# Patient Record
Sex: Female | Born: 1970 | Race: White | Hispanic: No | Marital: Single | Smoking: Never smoker
Health system: Southern US, Community
[De-identification: ages and names within clinical notes are randomized; demographics above are authoritative.]

## PROBLEM LIST (undated history)

## (undated) DIAGNOSIS — F32A Depression, unspecified: Secondary | ICD-10-CM

## (undated) DIAGNOSIS — T7840XA Allergy, unspecified, initial encounter: Secondary | ICD-10-CM

## (undated) HISTORY — DX: Allergy, unspecified, initial encounter: T78.40XA

## (undated) HISTORY — DX: Depression, unspecified: F32.A

---

## 2015-05-29 ENCOUNTER — Other Ambulatory Visit: Payer: Self-pay | Admitting: Physician Assistant

## 2015-05-29 DIAGNOSIS — N950 Postmenopausal bleeding: Secondary | ICD-10-CM

## 2015-05-30 ENCOUNTER — Other Ambulatory Visit: Payer: Self-pay

## 2015-05-30 DIAGNOSIS — Z1231 Encounter for screening mammogram for malignant neoplasm of breast: Secondary | ICD-10-CM

## 2015-06-03 ENCOUNTER — Other Ambulatory Visit: Payer: Self-pay

## 2015-06-04 ENCOUNTER — Ambulatory Visit
Admission: RE | Admit: 2015-06-04 | Discharge: 2015-06-04 | Disposition: A | Discharge status: Home / Self Care | Payer: 59 | Source: Ambulatory Visit | Attending: Physician Assistant | Admitting: Physician Assistant

## 2015-06-04 DIAGNOSIS — N950 Postmenopausal bleeding: Secondary | ICD-10-CM

## 2015-06-09 ENCOUNTER — Other Ambulatory Visit: Payer: Self-pay | Admitting: Family Medicine

## 2015-06-09 DIAGNOSIS — N83202 Unspecified ovarian cyst, left side: Secondary | ICD-10-CM

## 2015-06-23 ENCOUNTER — Ambulatory Visit
Admission: RE | Admit: 2015-06-23 | Discharge: 2015-06-23 | Disposition: A | Discharge status: Home / Self Care | Payer: 59 | Source: Ambulatory Visit

## 2015-06-23 DIAGNOSIS — Z1231 Encounter for screening mammogram for malignant neoplasm of breast: Secondary | ICD-10-CM

## 2015-10-16 ENCOUNTER — Other Ambulatory Visit: Payer: Self-pay | Admitting: Family Medicine

## 2015-10-16 DIAGNOSIS — N83202 Unspecified ovarian cyst, left side: Secondary | ICD-10-CM

## 2015-10-28 ENCOUNTER — Ambulatory Visit
Admission: RE | Admit: 2015-10-28 | Discharge: 2015-10-28 | Disposition: A | Discharge status: Home / Self Care | Payer: 59 | Source: Ambulatory Visit | Attending: Family Medicine | Admitting: Family Medicine

## 2015-10-28 DIAGNOSIS — N83202 Unspecified ovarian cyst, left side: Secondary | ICD-10-CM

## 2016-02-23 ENCOUNTER — Other Ambulatory Visit (HOSPITAL_COMMUNITY)
Admission: RE | Admit: 2016-02-23 | Discharge: 2016-02-23 | Disposition: A | Discharge status: Home / Self Care | Payer: BLUE CROSS/BLUE SHIELD | Source: Ambulatory Visit | Attending: Family Medicine | Admitting: Family Medicine

## 2016-02-23 ENCOUNTER — Other Ambulatory Visit: Payer: Self-pay | Admitting: Family Medicine

## 2016-02-23 DIAGNOSIS — Z1151 Encounter for screening for human papillomavirus (HPV): Secondary | ICD-10-CM | POA: Insufficient documentation

## 2016-02-23 DIAGNOSIS — Z124 Encounter for screening for malignant neoplasm of cervix: Secondary | ICD-10-CM | POA: Insufficient documentation

## 2016-02-25 LAB — CYTOLOGY - PAP

## 2016-09-20 IMAGING — US US TRANSVAGINAL NON-OB
1 series · 13 of 25 positions shown · non-contrast
Comparison: None

CLINICAL DATA: Postmenopausal bleeding, no report of pain.



[Series 1: us transvaginal non-ob · 0.37mm/px · 13 of 40 slices shown]
[im 1/40]
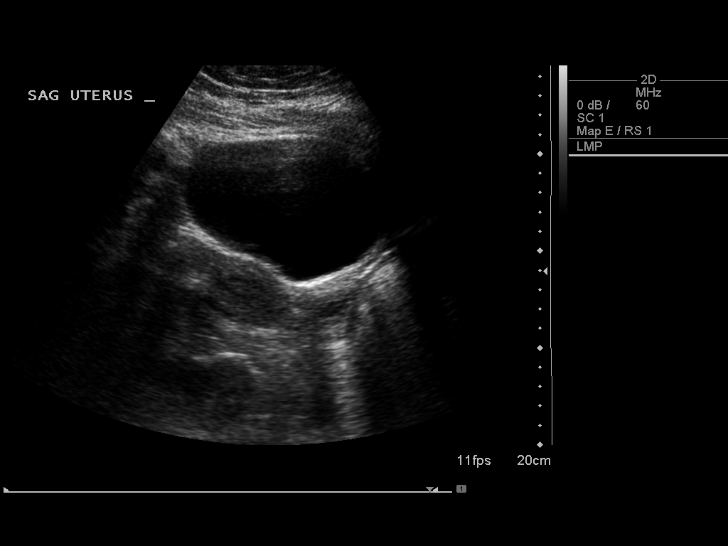
[im 4/40]
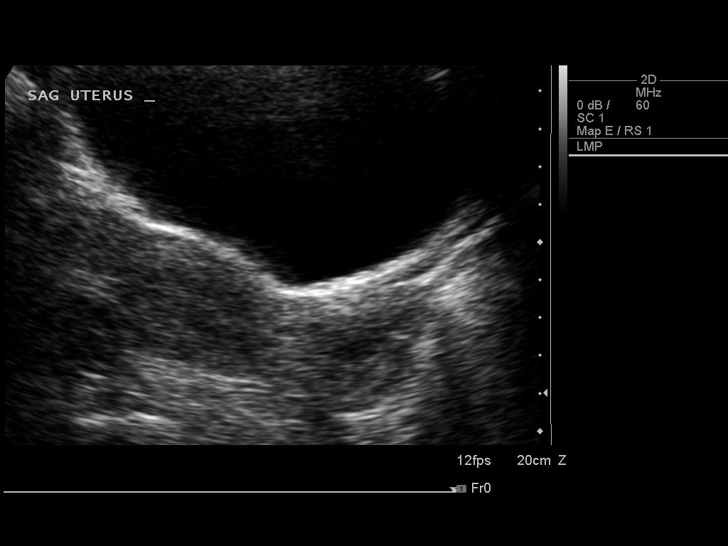
[im 7/40]
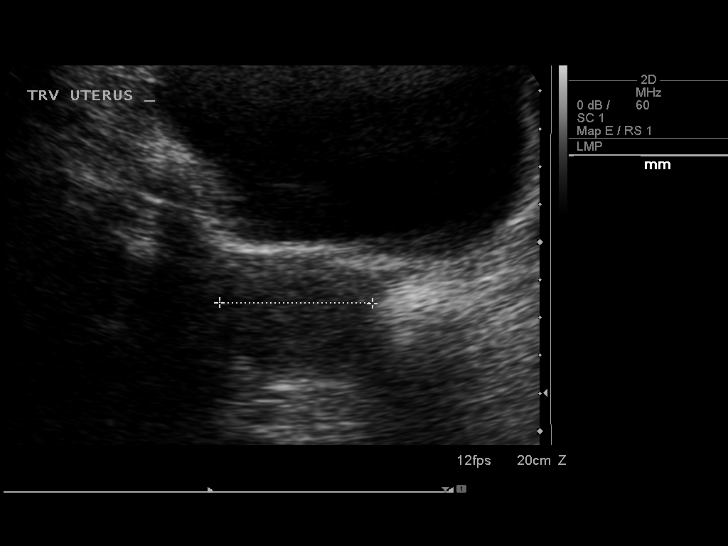
[im 10/40]
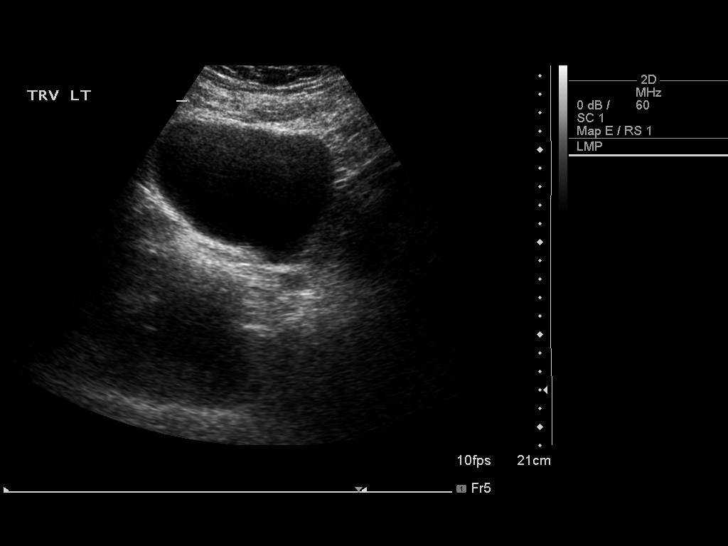
[im 14/40]
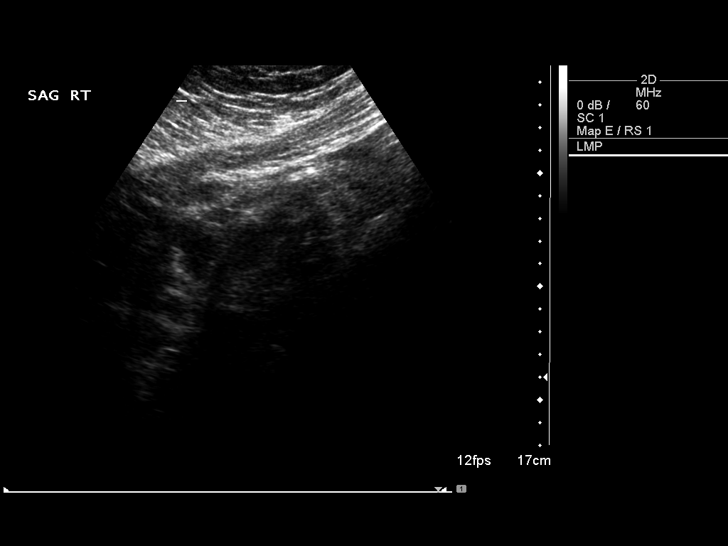
[im 17/40]
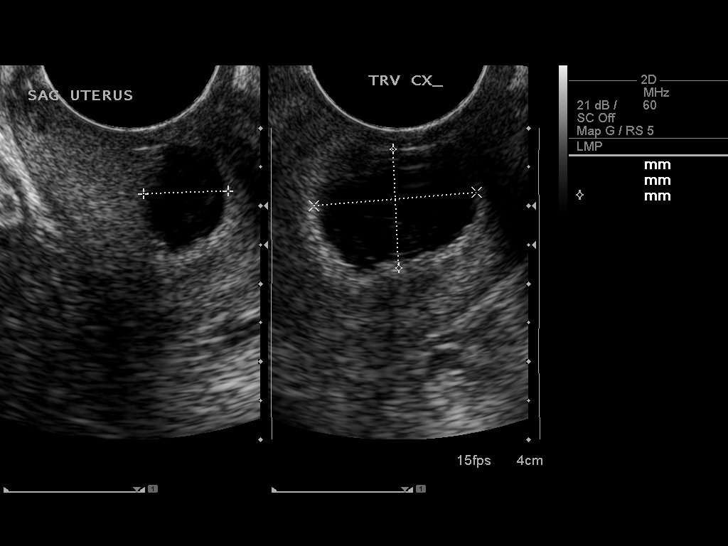
[im 20/40]
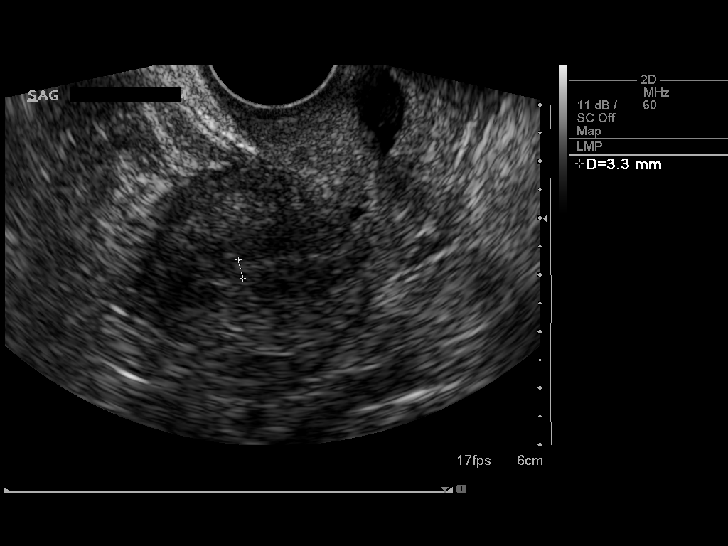
[im 23/40]
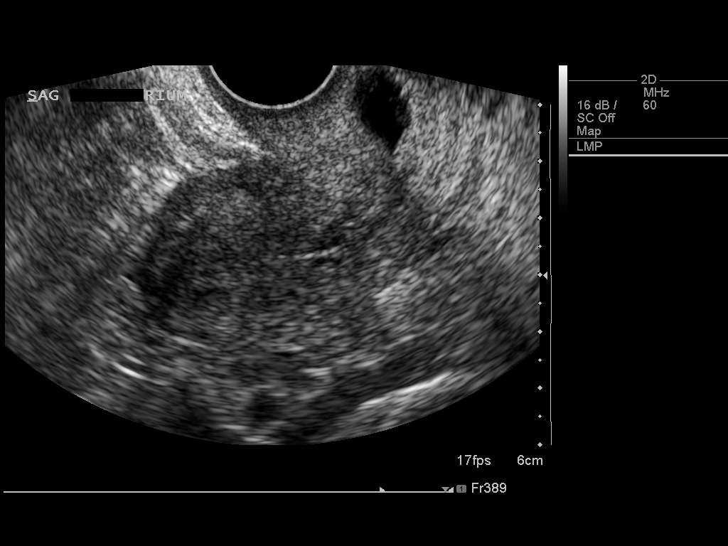
[im 27/40]
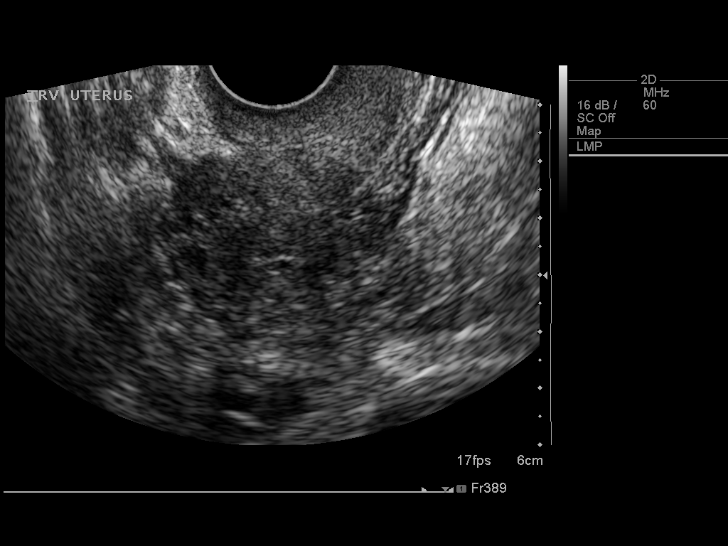
[im 30/40]
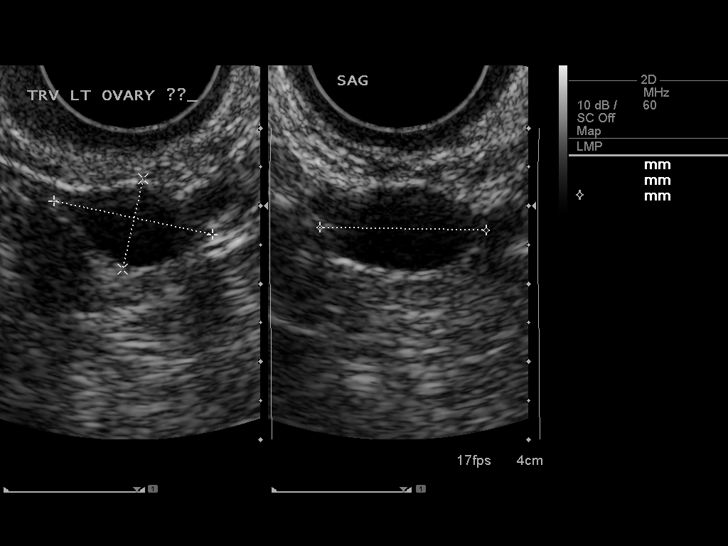
[im 33/40]
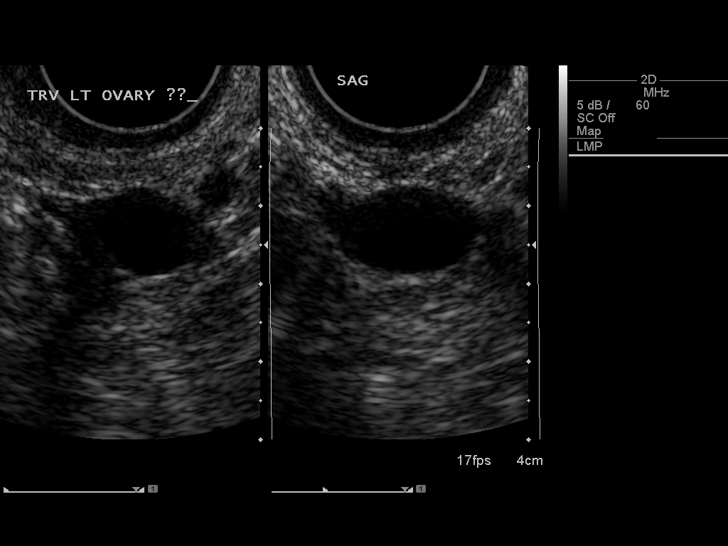
[im 36/40]
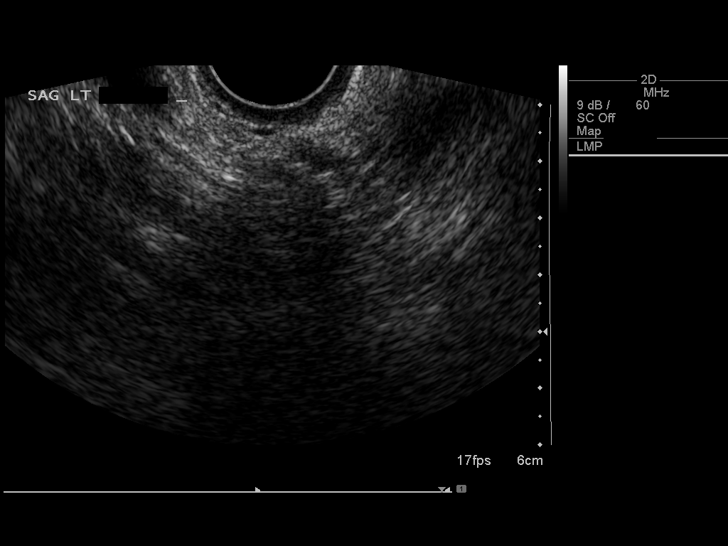
[im 40/40]
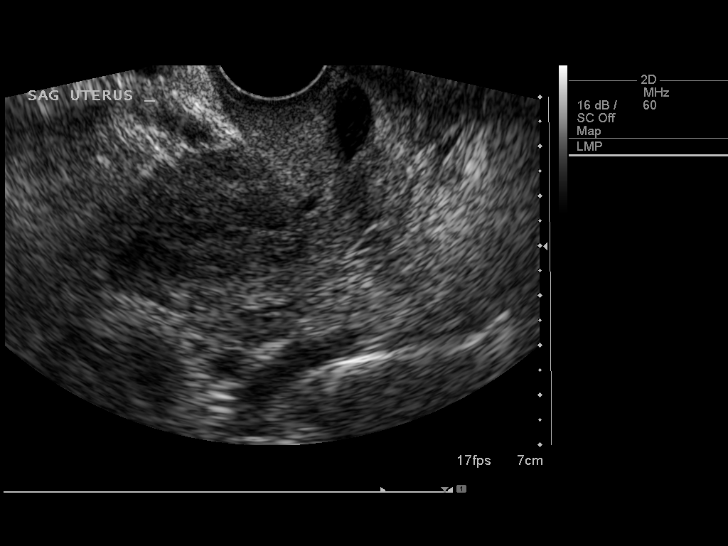

[13 of 25 positions shown; findings below may reference images not displayed]

FINDINGS: Uterus

Measurements: 8.5 x 3.3 x 4.1 cm.. No fibroids are observed. In the
lower uterine segment a cystic structure measuring 1.1 x 2.1 x
cm is demonstrated. This may reflect a nabothian cyst.

Endometrium

Thickness: 3.3 mm.  No focal abnormality visualized.

Right ovary

The right ovary could not be demonstrated.

Left ovary

Measurements: 2.1 x 1.2 x 2.1 cm.. There is no free pelvic fluid.

Other findings

No free fluid.
IMPRESSION: 1. The uterus is normal in contour and exhibits no fibroids. There
is a probable nabothian cyst. The endometrial stripe is not
abnormally thickened. In the setting of post-menopausal bleeding,
this is consistent with a benign etiology such as endometrial
atrophy. If bleeding remains unresponsive to hormonal or medical
therapy, sonohysterogram should be considered for focal lesion
work-up. (Ref: Radiological Reasoning: Algorithmic Workup of
Abnormal Vaginal Bleeding with Endovaginal Sonography and
Sonohysterography. AJR [id number]; 191:S68-73)
2. There is a probable left ovarian cyst measuring 1.8 cm in
greatest dimension. The right ovary could not be demonstrated.
3. There is no free pelvic fluid.

## 2017-02-13 IMAGING — US US PELVIS COMPLETE
1 series · 13 of 25 positions shown · non-contrast
Comparison: Pelvic ultrasound June 04, 2015

CLINICAL DATA: Follow-up of left ovarian cyst, asymptomatic ;
patient very menopausal with last normal menstrual period June 13, 2015.

EXAM:
TRANSABDOMINAL AND TRANSVAGINAL ULTRASOUND OF PELVIS
TECHNIQUE: Both transabdominal and transvaginal ultrasound examinations of the
pelvis were performed. Transabdominal technique was performed for
global imaging of the pelvis including uterus, ovaries, adnexal
regions, and pelvic cul-de-sac. It was necessary to proceed with
endovaginal exam following the transabdominal exam to visualize the
endometrium, ovaries, and adnexal regions..

[Series 1: us pelvis complete · 0.33mm/px · 13 of 55 slices shown]
[im 1/55]
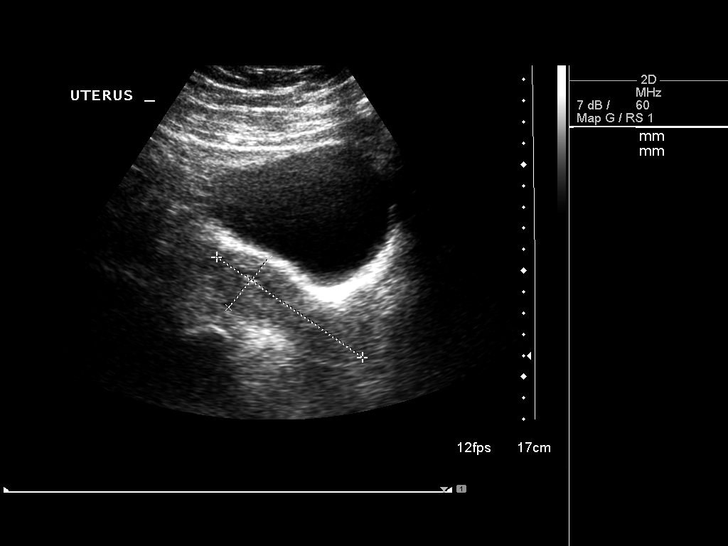
[im 5/55]
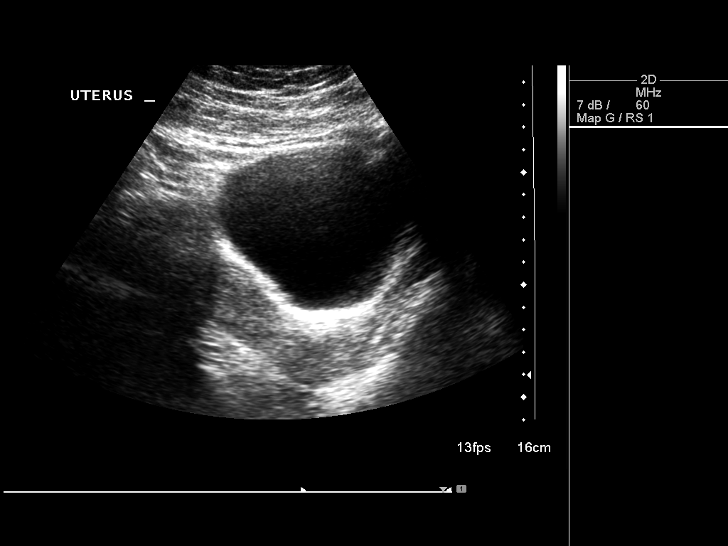
[im 10/55]
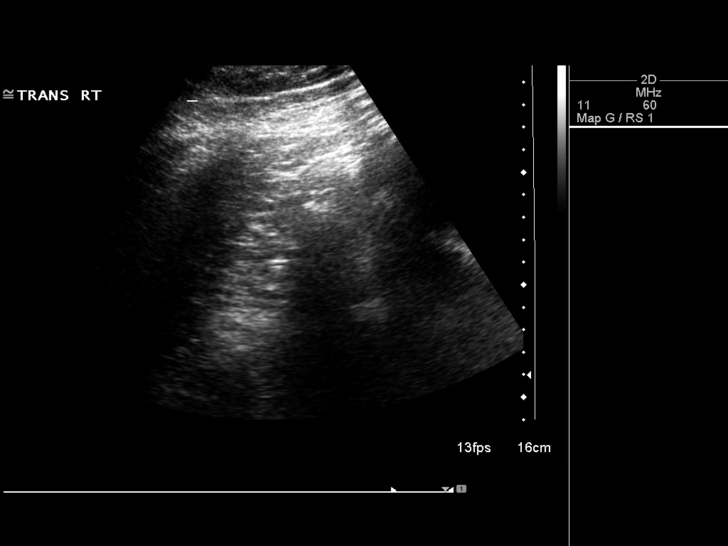
[im 14/55]
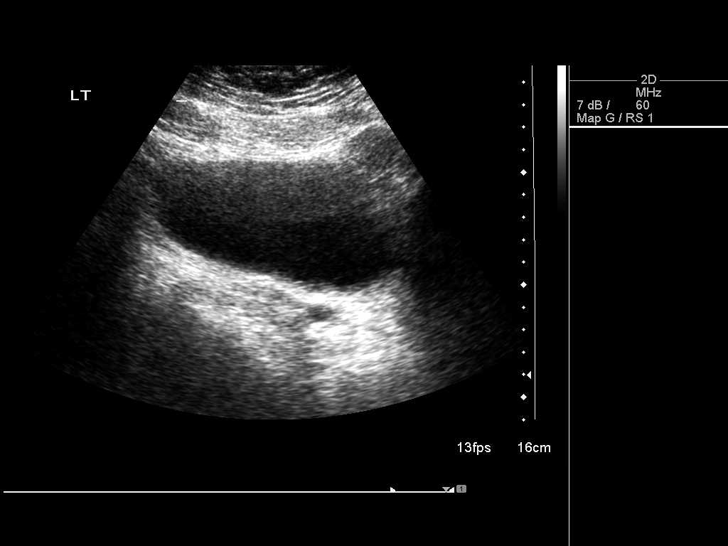
[im 19/55]
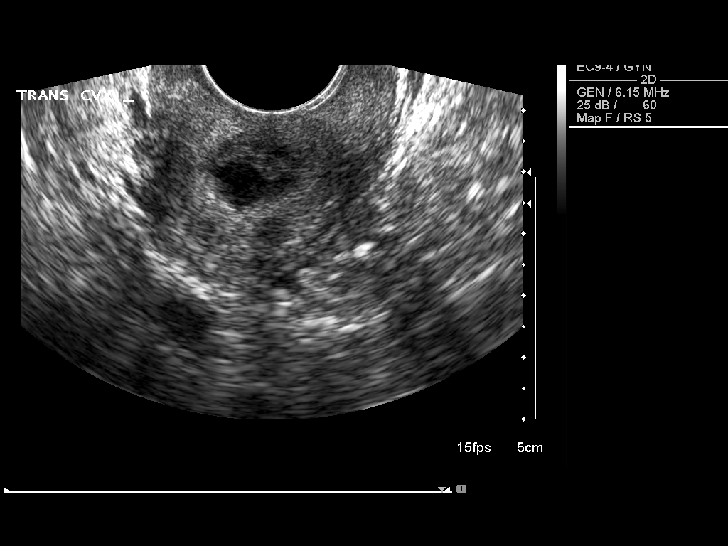
[im 23/55]
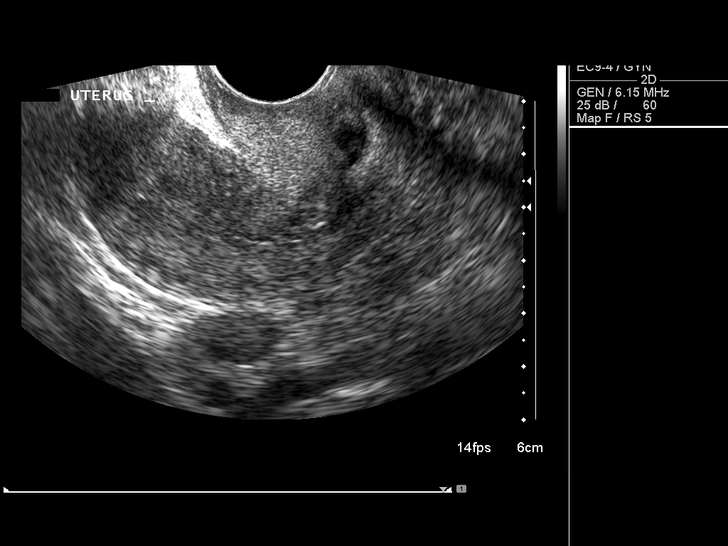
[im 28/55]
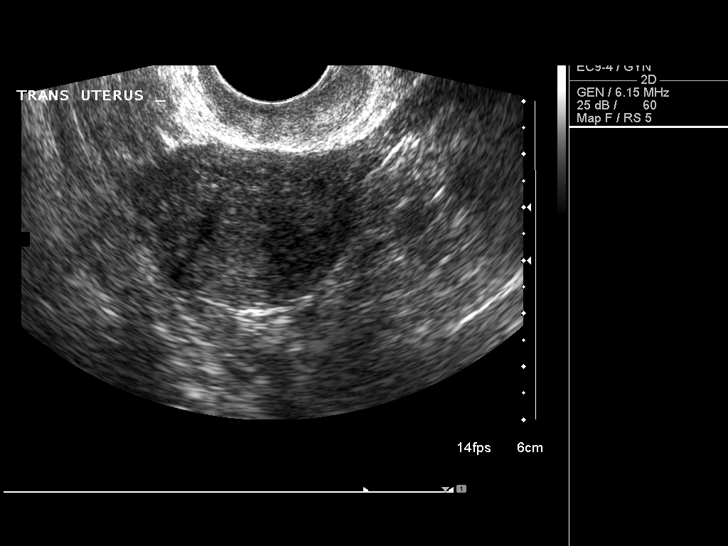
[im 32/55]
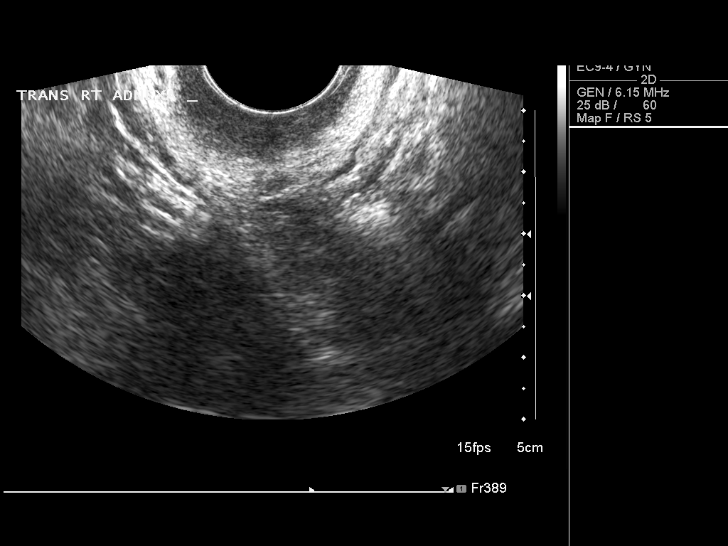
[im 37/55]
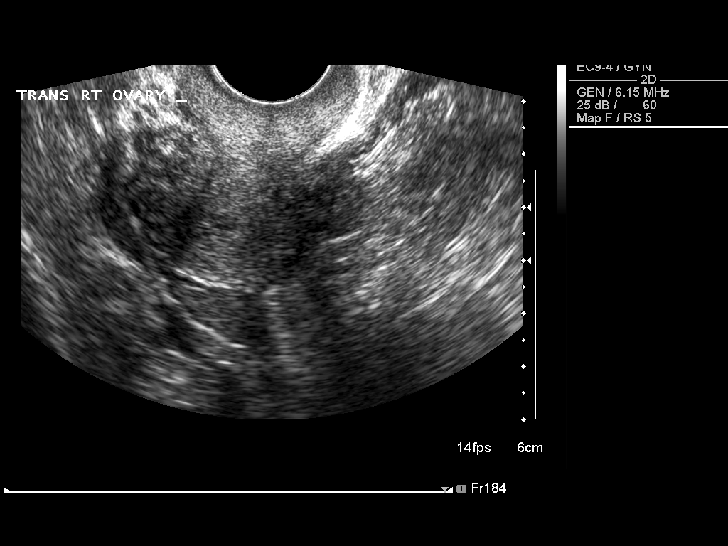
[im 41/55]
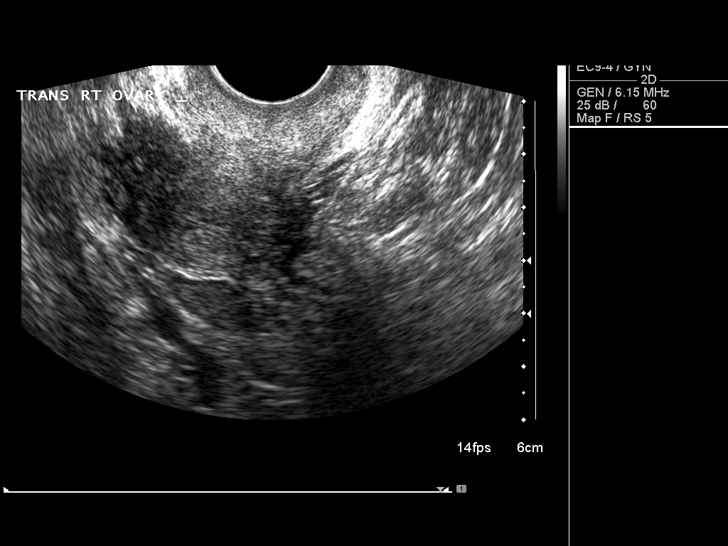
[im 46/55]
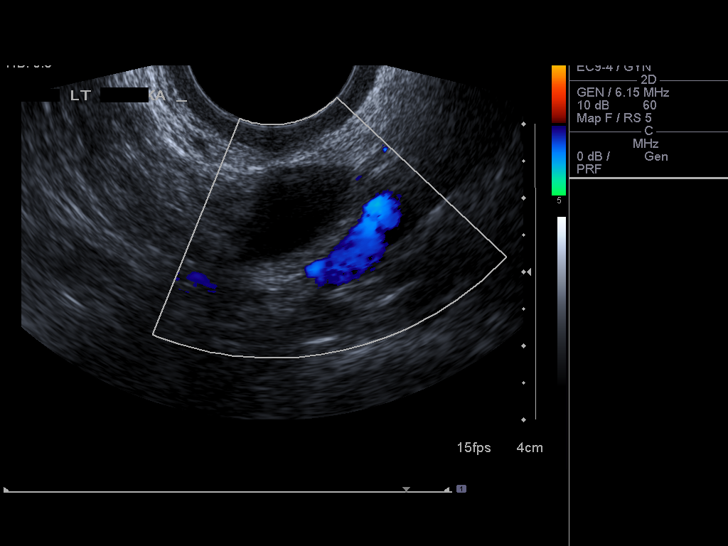
[im 50/55]
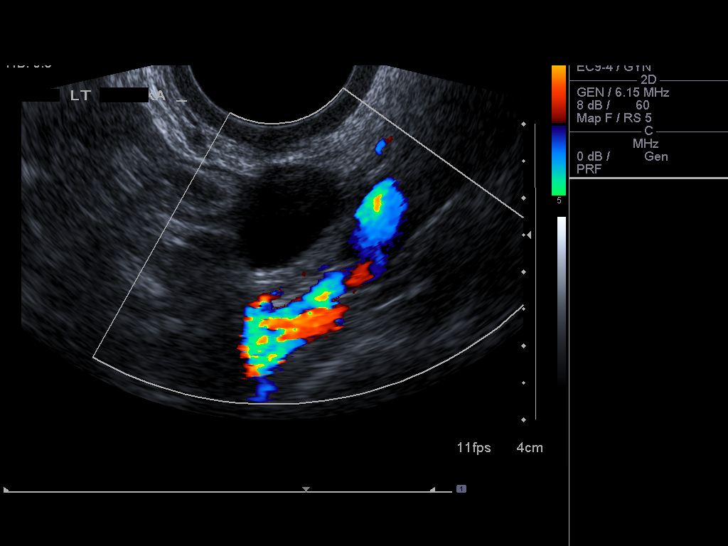
[im 55/55]
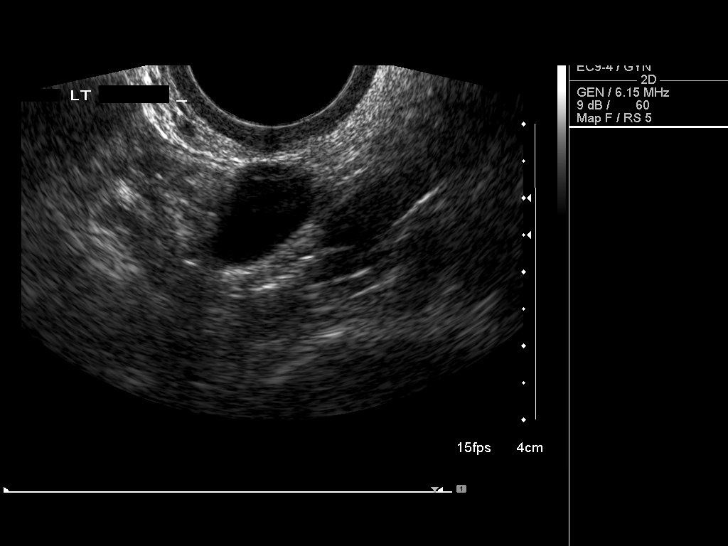

[13 of 25 positions shown; findings below may reference images not displayed]

FINDINGS: Uterus

Measurements: 8.3 x 3.0 x 4.1 cm. Within the cervix there is an
irregular hypoechoic region which was a site of a nabothian cyst on
the previous study. This may reflect a collapsing nabothian cyst.

Endometrium

Thickness: 2.3 mm.  No focal endometrial abnormality is observed.

Right ovary

Measurements: 2.1 x 1.1 x 1.7 cm.. Normal appearance/no adnexal
mass.

Left ovary

Measurements: A normal-appearing left ovary could not be
demonstrated. There is a cystic structure in the left adnexal region
measuring 1.7 x 1 x 1.4 cm.

Other findings

No free fluid.
IMPRESSION: 1. Stable appearing left adnexal cyst. A discrete normal-appearing
left ovary could not be demonstrated. The right ovary is
unremarkable.
2. Probable collapsing nabothian cyst in the cervix. The uterine
corpus and the endometrium are unremarkable.

## 2018-04-11 LAB — HM MAMMOGRAPHY

## 2023-03-25 ENCOUNTER — Ambulatory Visit (INDEPENDENT_AMBULATORY_CARE_PROVIDER_SITE_OTHER): Payer: PRIVATE HEALTH INSURANCE | Admitting: Family

## 2023-03-25 ENCOUNTER — Encounter: Payer: Self-pay | Admitting: Family

## 2023-03-25 VITALS — BP 129/77 | HR 74 | Temp 97.6°F | Ht 63.0 in | Wt 241.2 lb

## 2023-03-25 DIAGNOSIS — F900 Attention-deficit hyperactivity disorder, predominantly inattentive type: Secondary | ICD-10-CM | POA: Diagnosis not present

## 2023-03-25 DIAGNOSIS — E559 Vitamin D deficiency, unspecified: Secondary | ICD-10-CM

## 2023-03-25 DIAGNOSIS — F339 Major depressive disorder, recurrent, unspecified: Secondary | ICD-10-CM | POA: Insufficient documentation

## 2023-03-25 MED ORDER — DULOXETINE HCL 60 MG PO CPEP: 60.0000 mg | ORAL_CAPSULE | Freq: Every day | ORAL | 1 refills | Status: DC

## 2023-03-25 MED ORDER — LISDEXAMFETAMINE DIMESYLATE 40 MG PO CAPS: 40.0000 mg | ORAL_CAPSULE | Freq: Every day | ORAL | 0 refills | Status: DC

## 2023-03-25 NOTE — Patient Instructions (Signed)
Welcome to [facility], It was a pleasure meeting you today!    As discussed, I have sent your refills to your pharmacy.  Please schedule a 3 month follow up visit today.  Have a great weekend!   PLEASE NOTE: If you had any LAB tests please let us know if you have not heard back within a few days. You may see your results on MyChart before we have a chance to review them but we will give you a call once they are reviewed by [place]. If we ordered any REFERRALS today, please let us know if you have not heard from their office within the next week.  Let us know through MyChart if you are needing REFILLS, or have your pharmacy send [place] the request. You can also use MyChart to communicate with me or any office staff.  Please try these tips to maintain a healthy lifestyle: It is important that you exercise regularly at least 30 minutes 5 times a week. Think about what you will eat, plan ahead. Choose whole foods, & think  "clean, green, fresh or frozen" over canned, processed or packaged foods which are more sugary, salty, and fatty. 70 to 75% of food eaten should be fresh vegetables and protein. 2-3  meals daily with healthy snacks between meals, but must be whole fruit, protein or vegetables. Aim to eat over a 10 hour period when you are active, for example, 7am to 5pm, and then STOP after your last meal of the day, drinking only water.  Shorter eating windows, 6-8 hours, are showing benefits in heart disease and blood sugar regulation. Drink water every day! Shoot for 64 ounces daily = 8 cups, no other drink is as healthy! Fruit juice is best enjoyed in a healthy way, by EATING the fruit.

## 2023-03-25 NOTE — Progress Notes (Unsigned)
New Patient Office Visit  Subjective:  Patient ID: Lisa Stone, female    DOB: Apr 22, 1971  Age: 52 y.o. MRN: [id number]  CC:  Chief Complaint  Patient presents with   New Patient (Initial Visit)   Depression    Refill    ADHD    HPI Lisa Stone presents for establishing care today.  Depression:   has had since late 20s, on Cymbalta since 2010, 60mg , doing well. Has had therapy in past, not currently.   ADHD:  DX about 1.31yr ago, and only started on Vyvanse and has tolerated, no other meds, has been off for a few  Assessment & Plan:  ADHD (attention deficit hyperactivity disorder), inattentive type -     Lisdexamfetamine Dimesylate; Take 1 capsule (40 mg total) by mouth daily before breakfast.  Dispense: 30 capsule; Refill: 0 -     Lisdexamfetamine Dimesylate; Take 1 capsule (40 mg total) by mouth daily before breakfast.  Dispense: 30 capsule; Refill: 0 -     Lisdexamfetamine Dimesylate; Take 1 capsule (40 mg total) by mouth daily before breakfast.  Dispense: 30 capsule; Refill: 0  Vitamin D deficiency -     VITAMIN D 25 Hydroxy (Vit-D Deficiency, Fractures)  Depression, recurrent (HCC) -     DULoxetine HCl; Take 1 capsule (60 mg total) by mouth daily.  Dispense: 90 capsule; Refill: 1    Subjective:    Outpatient Medications Prior to Visit  Medication Sig Dispense Refill   diphenhydrAMINE (BENADRYL ALLERGY) 25 MG tablet Take 25 mg by mouth every 6 (six) hours as needed.     Fexofenadine HCl (ALLEGRA PO) Take by mouth.     Loratadine (CLARITIN PO) Take by mouth.     MELATONIN PO Take by mouth.     Vitamin D, Ergocalciferol, (DRISDOL) 1.25 MG (50000 UNIT) CAPS capsule Take 50,000 Units by mouth every 7 (seven) days.     DULoxetine (CYMBALTA) 60 MG capsule Take 60 mg by mouth daily.     lisdexamfetamine (VYVANSE) 40 MG capsule Take 40 mg by mouth.     No facility-administered medications prior to visit.   Past Medical History:  Diagnosis Date   Allergy childhood    Depression early teenage years   have been taking anti-depressants since my late 17s   No past surgical history on file.  Objective:   Today's Vitals: BP 129/77   Pulse 74   Temp 97.6 F (36.4 C) (Temporal)   Ht 5\' 3"  (1.6 m)   Wt 241 lb 3.2 oz (109.4 kg)   SpO2 97%   BMI 42.73 kg/m   Physical Exam Vitals and nursing note reviewed.  Constitutional:      Appearance: Normal appearance. She is obese.  Cardiovascular:     Rate and Rhythm: Normal rate and regular rhythm.  Pulmonary:     Effort: Pulmonary effort is normal.     Breath sounds: Normal breath sounds.  Musculoskeletal:        General: Normal range of motion.  Skin:    General: Skin is warm and dry.  Neurological:     Mental Status: She is alert.  Psychiatric:        Mood and Affect: Mood normal.        Behavior: Behavior normal.     Meds ordered this encounter  Medications   DULoxetine (CYMBALTA) 60 MG capsule    Sig: Take 1 capsule (60 mg total) by mouth daily.    Dispense:  90 capsule  Refill:  1   lisdexamfetamine (VYVANSE) 40 MG capsule    Sig: Take 1 capsule (40 mg total) by mouth daily before breakfast.    Dispense:  30 capsule    Refill:  0    Order Specific Question:   Supervising Provider    Answer:   ANDY, CAMILLE L [[id number]]   lisdexamfetamine (VYVANSE) 40 MG capsule    Sig: Take 1 capsule (40 mg total) by mouth daily before breakfast.    Dispense:  30 capsule    Refill:  0    Order Specific Question:   Supervising Provider    Answer:   ANDY, CAMILLE L [[id number]]   lisdexamfetamine (VYVANSE) 40 MG capsule    Sig: Take 1 capsule (40 mg total) by mouth daily before breakfast.    Dispense:  30 capsule    Refill:  0    Order Specific Question:   Supervising Provider    Answer:   ANDY, CAMILLE L [[id number]]    Dulce Sellar, NP

## 2023-03-26 LAB — VITAMIN D 25 HYDROXY (VIT D DEFICIENCY, FRACTURES): Vit D, 25-Hydroxy: 47 ng/mL (ref 30–100)

## 2023-03-28 NOTE — Assessment & Plan Note (Signed)
chronic, unstable pt ran out of Vyvanse 40mg  mos ago, moved here from [place], waiting on new provider sending refill, controlled substance contract signed, PDMP checked & verifeid sending refill f/u in 3 mos

## 2023-03-28 NOTE — Assessment & Plan Note (Signed)
chronic hx of RX Vit. D3 50K units qweek reports has not taken in months rechecking level today level at 47 - advised pt take 5,000units qd OTC continue to monitor

## 2023-03-28 NOTE — Assessment & Plan Note (Signed)
chronic, stable pt was taking Cymbalta 60mg  qd, hx of therapy, none now sending refill  f/u 6 mos or prn

## 2023-03-29 MED ORDER — VITAMIN D3 125 MCG (5000 UT) PO CAPS: 5000.0000 [IU] | ORAL_CAPSULE | Freq: Every day | ORAL | 3 refills | Status: DC

## 2023-03-29 NOTE — Addendum Note (Signed)
Addended byDulce Sellar on: 03/29/2023 09:58 PM   Modules accepted: Orders

## 2023-06-23 ENCOUNTER — Ambulatory Visit (INDEPENDENT_AMBULATORY_CARE_PROVIDER_SITE_OTHER): Payer: PRIVATE HEALTH INSURANCE | Admitting: Family

## 2023-06-23 VITALS — BP 113/64 | HR 64 | Temp 97.5°F | Ht 63.0 in | Wt 231.8 lb

## 2023-06-23 DIAGNOSIS — F900 Attention-deficit hyperactivity disorder, predominantly inattentive type: Secondary | ICD-10-CM

## 2023-06-23 MED ORDER — LISDEXAMFETAMINE DIMESYLATE 40 MG PO CAPS: 40.0000 mg | ORAL_CAPSULE | Freq: Every day | ORAL | 0 refills | Status: DC

## 2023-06-23 NOTE — Progress Notes (Signed)
   Patient ID: Lisa Stone, female    DOB: 1971-10-09, 52 y.o.   MRN: [id number]  Chief Complaint  Patient presents with   ADHD    HPI: ADHD:  DX about 1.24yr ago, and only started on Vyvanse and has tolerated, no other meds, has been off for a few months. Looking for work, having hard time staying focused, would like to get back on her Vyvanse.   Assessment & Plan:  ADHD (attention deficit hyperactivity disorder), inattentive type Assessment & Plan: chronic, stable taking Vyvanse 40mg , tolerating, no SE, working well sending refill f/u in 3 mos  Orders: -     Lisdexamfetamine Dimesylate; Take 1 capsule (40 mg total) by mouth daily before breakfast.  Dispense: 90 capsule; Refill: 0   Subjective:    Outpatient Medications Prior to Visit  Medication Sig Dispense Refill   Cholecalciferol (VITAMIN D3) 125 MCG (5000 UT) CAPS Take 1 capsule (5,000 Units total) by mouth daily. 90 capsule 3   diphenhydrAMINE (BENADRYL ALLERGY) 25 MG tablet Take 25 mg by mouth every 6 (six) hours as needed.     DULoxetine (CYMBALTA) 60 MG capsule Take 1 capsule (60 mg total) by mouth daily. 90 capsule 1   Fexofenadine HCl (ALLEGRA PO) Take by mouth.     Loratadine (CLARITIN PO) Take by mouth.     MELATONIN PO Take by mouth.     lisdexamfetamine (VYVANSE) 40 MG capsule Take 1 capsule (40 mg total) by mouth daily before breakfast. 30 capsule 0   lisdexamfetamine (VYVANSE) 40 MG capsule Take 1 capsule (40 mg total) by mouth daily before breakfast. 30 capsule 0   lisdexamfetamine (VYVANSE) 40 MG capsule Take 1 capsule (40 mg total) by mouth daily before breakfast. 30 capsule 0   No facility-administered medications prior to visit.   Past Medical History:  Diagnosis Date   Allergy childhood   Depression early teenage years   have been taking anti-depressants since my late 5s   No past surgical history on file. Allergies  Allergen Reactions   Penicillins Hives and Anaphylaxis    Pt states she had  hives as a child.      Objective:    Physical Exam Vitals and nursing note reviewed.  Constitutional:      Appearance: Normal appearance.  Cardiovascular:     Rate and Rhythm: Normal rate and regular rhythm.  Pulmonary:     Effort: Pulmonary effort is normal.     Breath sounds: Normal breath sounds.  Musculoskeletal:        General: Normal range of motion.  Skin:    General: Skin is warm and dry.  Neurological:     Mental Status: She is alert.  Psychiatric:        Mood and Affect: Mood normal.        Behavior: Behavior normal.    BP 113/64   Pulse 64   Temp (!) 97.5 F (36.4 C) (Temporal)   Ht 5\' 3"  (1.6 m)   Wt 231 lb 12.8 oz (105.1 kg)   SpO2 96%   BMI 41.06 kg/m  Wt Readings from Last 3 Encounters:  06/23/23 231 lb 12.8 oz (105.1 kg)  03/25/23 241 lb 3.2 oz (109.4 kg)       Dulce Sellar, NP

## 2023-06-23 NOTE — Assessment & Plan Note (Addendum)
chronic, stable taking Vyvanse 40mg , tolerating, no SE, working well sending refill for 90 pills f/u in 3 mos

## 2023-09-22 ENCOUNTER — Other Ambulatory Visit: Payer: Self-pay | Admitting: Family

## 2023-09-22 DIAGNOSIS — F339 Major depressive disorder, recurrent, unspecified: Secondary | ICD-10-CM

## 2023-10-03 ENCOUNTER — Telehealth (INDEPENDENT_AMBULATORY_CARE_PROVIDER_SITE_OTHER): Payer: PRIVATE HEALTH INSURANCE | Admitting: Family

## 2023-10-03 DIAGNOSIS — F900 Attention-deficit hyperactivity disorder, predominantly inattentive type: Secondary | ICD-10-CM

## 2023-10-03 MED ORDER — LISDEXAMFETAMINE DIMESYLATE 40 MG PO CAPS: 40.0000 mg | ORAL_CAPSULE | Freq: Every day | ORAL | 0 refills | Status: DC

## 2023-10-03 NOTE — Progress Notes (Signed)
MyChart Video Visit    Virtual Visit via Video Note   This format is felt to be most appropriate for this patient at this time. Physical exam was limited by quality of the video and audio technology used for the visit. CMA was able to get the patient set up on a video visit.  Patient location: Home. Patient and provider in visit Provider location: Office  I discussed the limitations of evaluation and management by telemedicine and the availability of in person appointments. The patient expressed understanding and agreed to proceed.  Visit Date: 10/03/2023  Today's healthcare provider: Dulce Sellar, NP     Subjective:   Patient ID: Lisa Stone, female    DOB: 1971/07/11, 51 y.o.   MRN: [id number]  Chief Complaint  Patient presents with   ADHD  HPI ADHD:  HX: pt was dx about 1.12yr ago, and only started on Vyvanse and has tolerated, no other meds, has been off for a few months. Looking for work, was having hard time staying focused, had taken Vyvanse in the past and we restarted at 40mg  dose. Pt tolerating, denies any SE.  Assessment & Plan:  ADHD (attention deficit hyperactivity disorder), inattentive type Assessment & Plan: chronic, stable taking Vyvanse 40mg , tolerating, no SE, working well sending refill for 90 pills f/u in 3-4 mos  Orders: -     Lisdexamfetamine Dimesylate; Take 1 capsule (40 mg total) by mouth daily before breakfast.  Dispense: 90 capsule; Refill: 0   Past Medical History:  Diagnosis Date   Allergy childhood   Depression early teenage years   have been taking anti-depressants since my late 47s   No past surgical history on file.  Outpatient Medications Prior to Visit  Medication Sig Dispense Refill   Cholecalciferol (VITAMIN D3) 125 MCG (5000 UT) CAPS Take 1 capsule (5,000 Units total) by mouth daily. 90 capsule 3   diphenhydrAMINE (BENADRYL ALLERGY) 25 MG tablet Take 25 mg by mouth every 6 (six) hours as needed.     DULoxetine  (CYMBALTA) 60 MG capsule TAKE 1 CAPSULE BY MOUTH EVERY DAY 90 capsule 1   Fexofenadine HCl (ALLEGRA PO) Take by mouth.     Loratadine (CLARITIN PO) Take by mouth.     MELATONIN PO Take by mouth.     lisdexamfetamine (VYVANSE) 40 MG capsule Take 1 capsule (40 mg total) by mouth daily before breakfast. 90 capsule 0   No facility-administered medications prior to visit.    Allergies  Allergen Reactions   Penicillins Hives and Anaphylaxis    Pt states she had hives as a child.       Objective:   Physical Exam Vitals and nursing note reviewed.  Constitutional:      General: Pt is not in acute distress.    Appearance: Normal appearance.  HENT:     Head: Normocephalic.  Pulmonary:     Effort: No respiratory distress.  Musculoskeletal:     Cervical back: Normal range of motion.  Skin:    General: Skin is dry.     Coloration: Skin is not pale.  Neurological:     Mental Status: Pt is alert and oriented to person, place, and time.  Psychiatric:        Mood and Affect: Mood normal.   There were no vitals taken for this visit.  Wt Readings from Last 3 Encounters:  06/23/23 231 lb 12.8 oz (105.1 kg)  03/25/23 241 lb 3.2 oz (109.4 kg)  I discussed the assessment and treatment plan with the patient. The patient was provided an opportunity to ask questions and all were answered. The patient agreed with the plan and demonstrated an understanding of the instructions.   The patient was advised to call back or seek an in-person evaluation if the symptoms worsen or if the condition fails to improve as anticipated.  Dulce Sellar, NP [facility] [place] [phone] (phone) [phone] (fax)  [facility]

## 2023-10-03 NOTE — Assessment & Plan Note (Signed)
chronic, stable taking Vyvanse 40mg , tolerating, no SE, working well sending refill for 90 pills f/u in 3-4 mos

## 2023-10-06 ENCOUNTER — Telehealth: Payer: Self-pay | Admitting: Family

## 2023-10-06 NOTE — Telephone Encounter (Signed)
LVM to schedule 3 month f/u as advised following 10/03/23 VV.

## 2024-02-07 ENCOUNTER — Encounter: Payer: Self-pay | Admitting: Family

## 2024-02-07 ENCOUNTER — Ambulatory Visit: Payer: PRIVATE HEALTH INSURANCE | Admitting: Family

## 2024-02-07 VITALS — BP 110/53 | HR 69 | Temp 98.2°F | Ht 63.0 in | Wt 232.8 lb

## 2024-02-07 DIAGNOSIS — F900 Attention-deficit hyperactivity disorder, predominantly inattentive type: Secondary | ICD-10-CM

## 2024-02-07 DIAGNOSIS — J301 Allergic rhinitis due to pollen: Secondary | ICD-10-CM

## 2024-02-07 MED ORDER — LISDEXAMFETAMINE DIMESYLATE 30 MG PO CAPS: 30.0000 mg | ORAL_CAPSULE | Freq: Every day | ORAL | 0 refills | Status: DC

## 2024-02-07 NOTE — Progress Notes (Signed)
 Patient ID: Lisa Stone, female    DOB: Sep 14, 1971, 53 y.o.   MRN: [id number]  Chief Complaint  Patient presents with   ADHD       Discussed the use of AI scribe software for clinical note transcription with the patient, who gave verbal consent to proceed.  History of Present Illness   The patient, on Vyvanse for ADHD, requests a dose reduction due to feeling 'a little jittery.' She denies sleep issues and blood pressure problems. She acknowledges that caffeine may be contributing to the jitteriness. She has recently returned to work.  The patient also takes Cymbalta, which is well tolerated. She supplements with over-the-counter Vitamin D and manages seasonal allergies with over-the-counter medications. She reports that her allergies have been 'really bad' recently.     Assessment & Plan:     ADHD - Current Vyvanse dosage causing jitteriness. Agreed to reduce dosage to 30 mg. Advised on caffeine intake and timing of medication. - Decrease Vyvanse dosage to 30 mg, sending refill for 90d. - Advise to monitor caffeine intake. - Instruct to take Vyvanse early in the day. -F/U in 3 mos, ok if virtual.  Allergic Rhinitis - Advised proactive allergy management with nasal sprays and oral antihistamines. - Advise use of over-the-counter nasal sprays like generic Flonase or Nasacort, use 1 squirt each nostril bid x 3d, then qd or qod to control sx. - Recommend continued use of oral antihistamines like Claritin or Zyrtec as needed. -F/U prn      Subjective:    Outpatient Medications Prior to Visit  Medication Sig Dispense Refill   Cholecalciferol (VITAMIN D3) 125 MCG (5000 UT) CAPS Take 1 capsule (5,000 Units total) by mouth daily. 90 capsule 3   diphenhydrAMINE (BENADRYL ALLERGY) 25 MG tablet Take 25 mg by mouth every 6 (six) hours as needed.     DULoxetine (CYMBALTA) 60 MG capsule TAKE 1 CAPSULE BY MOUTH EVERY DAY 90 capsule 1   Fexofenadine HCl (ALLEGRA PO) Take by mouth.      Loratadine (CLARITIN PO) Take by mouth.     MELATONIN PO Take by mouth.     lisdexamfetamine (VYVANSE) 40 MG capsule Take 1 capsule (40 mg total) by mouth daily before breakfast. 90 capsule 0   No facility-administered medications prior to visit.   Past Medical History:  Diagnosis Date   Allergy childhood   Depression early teenage years   have been taking anti-depressants since my late 34s   No past surgical history on file. Allergies  Allergen Reactions   Penicillins Hives and Anaphylaxis    Pt states she had hives as a child.      Objective:    Physical Exam Vitals and nursing note reviewed.  Constitutional:      Appearance: Normal appearance.  Cardiovascular:     Rate and Rhythm: Normal rate and regular rhythm.  Pulmonary:     Effort: Pulmonary effort is normal.     Breath sounds: Normal breath sounds.  Musculoskeletal:        General: Normal range of motion.  Skin:    General: Skin is warm and dry.  Neurological:     Mental Status: She is alert.  Psychiatric:        Mood and Affect: Mood normal.        Behavior: Behavior normal.    BP (!) 110/53 (BP Location: Left Arm, Patient Position: Sitting, Cuff Size: Large)   Pulse 69   Temp 98.2 F (36.8 C) (Temporal)  Ht 5\' 3"  (1.6 m)   Wt 232 lb 12.8 oz (105.6 kg)   SpO2 95%   BMI 41.24 kg/m  Wt Readings from Last 3 Encounters:  02/07/24 232 lb 12.8 oz (105.6 kg)  06/23/23 231 lb 12.8 oz (105.1 kg)  03/25/23 241 lb 3.2 oz (109.4 kg)      Dulce Sellar, NP

## 2024-02-07 NOTE — Assessment & Plan Note (Signed)
 Current Vyvanse dosage causing jitteriness. Agreed to reduce dosage to 30 mg. Advised on caffeine intake and timing of medication. - Decrease Vyvanse dosage to 30 mg, sending refill for 90d. - Advise to monitor caffeine intake. - Instruct to take Vyvanse early in the day. -F/U in 3 mos, ok if virtual.

## 2024-03-19 ENCOUNTER — Other Ambulatory Visit: Payer: Self-pay | Admitting: Family

## 2024-03-19 DIAGNOSIS — F339 Major depressive disorder, recurrent, unspecified: Secondary | ICD-10-CM

## 2024-03-31 ENCOUNTER — Other Ambulatory Visit: Payer: Self-pay | Admitting: Family

## 2024-03-31 DIAGNOSIS — E559 Vitamin D deficiency, unspecified: Secondary | ICD-10-CM

## 2024-04-20 ENCOUNTER — Other Ambulatory Visit: Payer: Self-pay | Admitting: Family

## 2024-04-20 DIAGNOSIS — F339 Major depressive disorder, recurrent, unspecified: Secondary | ICD-10-CM

## 2024-05-21 ENCOUNTER — Other Ambulatory Visit: Payer: Self-pay | Admitting: Family

## 2024-05-21 DIAGNOSIS — F339 Major depressive disorder, recurrent, unspecified: Secondary | ICD-10-CM

## 2024-05-25 ENCOUNTER — Encounter: Payer: Self-pay | Admitting: Family

## 2024-05-25 ENCOUNTER — Ambulatory Visit: Admitting: Family

## 2024-05-25 DIAGNOSIS — F339 Major depressive disorder, recurrent, unspecified: Secondary | ICD-10-CM

## 2024-05-25 DIAGNOSIS — F900 Attention-deficit hyperactivity disorder, predominantly inattentive type: Secondary | ICD-10-CM | POA: Diagnosis not present

## 2024-05-25 MED ORDER — DULOXETINE HCL 60 MG PO CPEP: 60.0000 mg | ORAL_CAPSULE | Freq: Every day | ORAL | 1 refills | Status: DC

## 2024-05-25 MED ORDER — LISDEXAMFETAMINE DIMESYLATE 30 MG PO CAPS: 30.0000 mg | ORAL_CAPSULE | Freq: Every day | ORAL | 0 refills | Status: DC

## 2024-05-25 NOTE — Assessment & Plan Note (Signed)
 chronic, stable taking Cymbalta  60mg  qd, denies SE sending refill  f/u 6 mos or prn

## 2024-05-25 NOTE — Progress Notes (Signed)
 Patient ID: Gibson Telleria, female    DOB: [birth year], 53 y.o.   MRN: [id number]  Chief Complaint  Patient presents with   medication f/u    ADHD and depression meds   ADHD f/u: Medications helping target goals: Vyvanse  30mg   Regimen: daily Medication side effects/concerns:  none Weight: no change Sleep: no issues Mood changes: none Tics: denies Blood pressure, Weight, Pulse, Behavior reviewed: wnl   Depression:   has had since late 20s, on Cymbalta  since 2010, 60mg , doing well. Has had therapy in past, not currently.   ASSESSMENT & PLAN: Problem List Items Addressed This Visit     ADHD (attention deficit hyperactivity disorder), inattentive type   Current Vyvanse  dosage 30 mg qd. Decreased last visit, doing better, denies SE. - - Vyvanse  30 mg, refill sent for 90d. - F/U in 3 mos, ok if virtual.      Relevant Medications   lisdexamfetamine (VYVANSE ) 30 MG capsule   Depression, recurrent (HCC)   chronic, stable taking Cymbalta  60mg  qd, denies SE sending refill  f/u 6 mos or prn      Relevant Medications   DULoxetine  (CYMBALTA ) 60 MG capsule   Subjective:    Outpatient Medications Prior to Visit  Medication Sig Dispense Refill   D-3-5 125 MCG (5000 UT) capsule TAKE 1 CAPSULE BY MOUTH EVERY DAY 90 capsule 3   diphenhydrAMINE (BENADRYL ALLERGY) 25 MG tablet Take 25 mg by mouth every 6 (six) hours as needed.     DULoxetine  (CYMBALTA ) 60 MG capsule Take 1 capsule (60 mg total) by mouth daily. Please schedule follow up with PCP before next refill is due. 30 capsule 0   Fexofenadine HCl (ALLEGRA PO) Take by mouth.     lisdexamfetamine (VYVANSE ) 30 MG capsule Take 1 capsule (30 mg total) by mouth daily before breakfast. 90 capsule 0   Loratadine (CLARITIN PO) Take by mouth.     MELATONIN PO Take by mouth.     No facility-administered medications prior to visit.   Past Medical History:  Diagnosis Date   Allergy childhood   Depression early teenage years   have been  taking anti-depressants since my late 43s   History reviewed. No pertinent surgical history. Allergies  Allergen Reactions   Penicillins Hives and Anaphylaxis    Pt states she had hives as a child.      Objective:    Physical Exam Vitals and nursing note reviewed.  Constitutional:      Appearance: Normal appearance.   Cardiovascular:     Rate and Rhythm: Normal rate and regular rhythm.  Pulmonary:     Effort: Pulmonary effort is normal.     Breath sounds: Normal breath sounds.   Musculoskeletal:        General: Normal range of motion.   Skin:    General: Skin is warm and dry.   Neurological:     Mental Status: She is alert.   Psychiatric:        Mood and Affect: Mood normal.        Behavior: Behavior normal.    BP 110/72   Pulse 78   Temp (!) 97.5 F (36.4 C)   Ht 5' 3 (1.6 m)   Wt 242 lb 3.2 oz (109.9 kg)   SpO2 98%   BMI 42.90 kg/m  Wt Readings from Last 3 Encounters:  05/25/24 242 lb 3.2 oz (109.9 kg)  02/07/24 232 lb 12.8 oz (105.6 kg)  06/23/23 231 lb 12.8 oz (105.1 kg)  Lucius Krabbe, NP

## 2024-05-25 NOTE — Assessment & Plan Note (Signed)
 Current Vyvanse  dosage 30 mg qd. Decreased last visit, doing better, denies SE. - - Vyvanse  30 mg, refill sent for 90d. - F/U in 3 mos, ok if virtual.

## 2024-09-28 ENCOUNTER — Encounter: Payer: Self-pay | Admitting: Family

## 2024-09-28 ENCOUNTER — Telehealth (INDEPENDENT_AMBULATORY_CARE_PROVIDER_SITE_OTHER): Admitting: Family

## 2024-09-28 DIAGNOSIS — F900 Attention-deficit hyperactivity disorder, predominantly inattentive type: Secondary | ICD-10-CM

## 2024-09-28 MED ORDER — LISDEXAMFETAMINE DIMESYLATE 30 MG PO CAPS: 30.0000 mg | ORAL_CAPSULE | Freq: Every day | ORAL | 0 refills | Status: DC

## 2024-09-28 NOTE — Progress Notes (Signed)
 MyChart Video Visit    Virtual Visit via Video Note   This format is felt to be most appropriate for this patient at this time. Physical exam was limited by quality of the video and audio technology used for the visit. CMA was able to get the patient set up on a video visit.  Patient location: Home. Patient and provider in visit Provider location: Office  I discussed the limitations of evaluation and management by telemedicine and the availability of in person appointments. The patient expressed understanding and agreed to proceed.  Visit Date: 09/28/2024  Today's healthcare provider: Lucius Krabbe, NP     Subjective:   Patient ID: Lisa Stone, female    DOB: 1971-03-02, 53 y.o.   MRN: [id number]  Chief Complaint  Patient presents with   ADHD  Discussed the use of AI scribe software for clinical note transcription with the patient, who gave verbal consent to proceed.  History of Present Illness Nature Lisa Stone is a 53 year old female who presents for ADHD medication renewal.  She has about a week's supply of her ADHD medication, Vyvanse  30mg , remaining and finds the current dose of 30 mg effective. She denies any side effects.  Assessment & Plan Attention-deficit hyperactivity disorder, predominantly inattentive type Well-managed on current Vyvanse  30mg  daily regimen. - Refill 30 mg Vyvanse , 90-day supply. - F/U in 3 mos in office. -     Lisdexamfetamine Dimesylate ; Take 1 capsule (30 mg total) by mouth daily before breakfast.  Dispense: 90 capsule; Refill: 0  Past Medical History:  Diagnosis Date   Allergy childhood   Depression early teenage years   have been taking anti-depressants since my late 84s    History reviewed. No pertinent surgical history.  Outpatient Medications Prior to Visit  Medication Sig Dispense Refill   D-3-5 125 MCG (5000 UT) capsule TAKE 1 CAPSULE BY MOUTH EVERY DAY 90 capsule 3   diphenhydrAMINE (BENADRYL ALLERGY) 25 MG tablet  Take 25 mg by mouth every 6 (six) hours as needed.     DULoxetine  (CYMBALTA ) 60 MG capsule Take 1 capsule (60 mg total) by mouth daily. Please schedule follow up with PCP before next refill is due. 90 capsule 1   Fexofenadine HCl (ALLEGRA PO) Take by mouth.     Loratadine (CLARITIN PO) Take by mouth.     MELATONIN PO Take by mouth.     lisdexamfetamine (VYVANSE ) 30 MG capsule Take 1 capsule (30 mg total) by mouth daily before breakfast. 90 capsule 0   No facility-administered medications prior to visit.    Allergies  Allergen Reactions   Penicillins Hives and Anaphylaxis    Pt states she had hives as a child.       Objective:   Physical Exam Vitals and nursing note reviewed.  Constitutional:      General: Pt is not in acute distress.    Appearance: Normal appearance.  HENT:     Head: Normocephalic.  Pulmonary:     Effort: No respiratory distress.  Musculoskeletal:     Cervical back: Normal range of motion.  Skin:    General: Skin is dry.     Coloration: Skin is not pale.  Neurological:     Mental Status: Pt is alert and oriented to person, place, and time.  Psychiatric:        Mood and Affect: Mood normal.   There were no vitals taken for this visit.  Wt Readings from Last 3 Encounters:  05/25/24 242 lb  3.2 oz (109.9 kg)  02/07/24 232 lb 12.8 oz (105.6 kg)  06/23/23 231 lb 12.8 oz (105.1 kg)      I discussed the assessment and treatment plan with the patient. The patient was provided an opportunity to ask questions and all were answered. The patient agreed with the plan and demonstrated an understanding of the instructions.   The patient was advised to call back or seek an in-person evaluation if the symptoms worsen or if the condition fails to improve as anticipated.  Lucius Krabbe, NP [facility] [phone] (phone) [phone] (fax)  [facility]

## 2024-12-31 ENCOUNTER — Telehealth: Admitting: Family

## 2024-12-31 ENCOUNTER — Encounter: Payer: Self-pay | Admitting: Family

## 2024-12-31 ENCOUNTER — Telehealth: Payer: Self-pay

## 2024-12-31 ENCOUNTER — Ambulatory Visit: Admitting: Family

## 2024-12-31 VITALS — Ht 63.0 in | Wt 242.0 lb

## 2024-12-31 DIAGNOSIS — E66812 Obesity, class 2: Secondary | ICD-10-CM | POA: Insufficient documentation

## 2024-12-31 DIAGNOSIS — F339 Major depressive disorder, recurrent, unspecified: Secondary | ICD-10-CM | POA: Diagnosis not present

## 2024-12-31 DIAGNOSIS — Z1231 Encounter for screening mammogram for malignant neoplasm of breast: Secondary | ICD-10-CM | POA: Insufficient documentation

## 2024-12-31 DIAGNOSIS — G4719 Other hypersomnia: Secondary | ICD-10-CM | POA: Insufficient documentation

## 2024-12-31 DIAGNOSIS — F900 Attention-deficit hyperactivity disorder, predominantly inattentive type: Secondary | ICD-10-CM | POA: Diagnosis not present

## 2024-12-31 DIAGNOSIS — R41840 Attention and concentration deficit: Secondary | ICD-10-CM | POA: Insufficient documentation

## 2024-12-31 DIAGNOSIS — G4733 Obstructive sleep apnea (adult) (pediatric): Secondary | ICD-10-CM | POA: Insufficient documentation

## 2024-12-31 DIAGNOSIS — E785 Hyperlipidemia, unspecified: Secondary | ICD-10-CM | POA: Insufficient documentation

## 2024-12-31 DIAGNOSIS — F329 Major depressive disorder, single episode, unspecified: Secondary | ICD-10-CM | POA: Insufficient documentation

## 2024-12-31 MED ORDER — LISDEXAMFETAMINE DIMESYLATE 30 MG PO CAPS: 30.0000 mg | ORAL_CAPSULE | Freq: Every day | ORAL | 0 refills | Status: AC

## 2024-12-31 MED ORDER — DULOXETINE HCL 60 MG PO CPEP: 60.0000 mg | ORAL_CAPSULE | Freq: Every day | ORAL | 1 refills | Status: AC

## 2024-12-31 NOTE — Telephone Encounter (Signed)
 Read encounter notes.

## 2025-01-01 ENCOUNTER — Ambulatory Visit: Admitting: Family
